# Patient Record
Sex: Female | Born: 1985 | Hispanic: No | Marital: Single | Smoking: Never smoker
Health system: Southern US, Community
[De-identification: ages and names within clinical notes are randomized; demographics above are authoritative.]

## PROBLEM LIST (undated history)

## (undated) DIAGNOSIS — O139 Gestational [pregnancy-induced] hypertension without significant proteinuria, unspecified trimester: Secondary | ICD-10-CM

---

## 2003-03-22 ENCOUNTER — Other Ambulatory Visit: Admission: RE | Admit: 2003-03-22 | Discharge: 2003-03-22 | Payer: Self-pay | Admitting: Obstetrics and Gynecology

## 2006-09-13 ENCOUNTER — Encounter: Admission: RE | Admit: 2006-09-13 | Discharge: 2006-09-13 | Payer: Self-pay | Admitting: Orthopedic Surgery

## 2011-09-11 ENCOUNTER — Other Ambulatory Visit: Payer: Self-pay | Admitting: Obstetrics and Gynecology

## 2014-10-25 ENCOUNTER — Other Ambulatory Visit (HOSPITAL_COMMUNITY): Payer: Self-pay | Admitting: Obstetrics & Gynecology

## 2014-10-25 DIAGNOSIS — N979 Female infertility, unspecified: Secondary | ICD-10-CM

## 2014-11-03 ENCOUNTER — Ambulatory Visit (HOSPITAL_COMMUNITY)
Admission: RE | Admit: 2014-11-03 | Discharge: 2014-11-03 | Disposition: A | Discharge status: Home / Self Care | Payer: BLUE CROSS/BLUE SHIELD | Source: Ambulatory Visit | Attending: Obstetrics & Gynecology | Admitting: Obstetrics & Gynecology

## 2014-11-03 DIAGNOSIS — N979 Female infertility, unspecified: Secondary | ICD-10-CM | POA: Diagnosis not present

## 2014-11-03 MED ORDER — IOHEXOL 300 MG/ML  SOLN
30.0000 mL | Freq: Once | INTRAMUSCULAR | Status: AC | PRN
  Administered 2014-11-03: 30 mL

## 2015-02-17 LAB — OB RESULTS CONSOLE HIV ANTIBODY (ROUTINE TESTING)
HIV: NONREACTIVE
HIV: NONREACTIVE

## 2015-05-07 NOTE — L&D Delivery Note (Signed)
Delivery Note At 7:13 AM a healthy female was delivered via Vaginal, Spontaneous Delivery (Presentation: Left Occiput Anterior).  APGAR: 8, 9; weight pending.   Placenta status: Intact, Spontaneous Pathology.  Cord: 3 vessels with the following complications: None.  Cord pH: N/A  Anesthesia: Epidural  Episiotomy: None Lacerations: None Suture Repair: 2.0 3.0 vicryl rapide Est. Blood Loss (mL): 200  Mom to postpartum.  Baby to Couplet care / Skin to Skin.  Jene Huq,MARIE-LYNE 08/30/2015, 7:55 AM

## 2015-08-14 LAB — OB RESULTS CONSOLE GBS: STREP GROUP B AG: POSITIVE

## 2015-08-25 ENCOUNTER — Other Ambulatory Visit: Payer: Self-pay | Admitting: Obstetrics & Gynecology

## 2015-08-28 ENCOUNTER — Inpatient Hospital Stay (HOSPITAL_COMMUNITY): Payer: BLUE CROSS/BLUE SHIELD | Admitting: Anesthesiology

## 2015-08-28 ENCOUNTER — Encounter (HOSPITAL_COMMUNITY): Payer: Self-pay

## 2015-08-28 ENCOUNTER — Inpatient Hospital Stay (HOSPITAL_COMMUNITY)
Admission: AD | Admit: 2015-08-28 | Discharge: 2015-09-01 | DRG: 775 | Disposition: A | Discharge status: Home / Self Care | Payer: BLUE CROSS/BLUE SHIELD | Source: Ambulatory Visit | Attending: Obstetrics & Gynecology | Admitting: Obstetrics & Gynecology

## 2015-08-28 DIAGNOSIS — O99824 Streptococcus B carrier state complicating childbirth: Secondary | ICD-10-CM | POA: Diagnosis present

## 2015-08-28 DIAGNOSIS — O134 Gestational [pregnancy-induced] hypertension without significant proteinuria, complicating childbirth: Secondary | ICD-10-CM | POA: Diagnosis present

## 2015-08-28 DIAGNOSIS — Z3A37 37 weeks gestation of pregnancy: Secondary | ICD-10-CM

## 2015-08-28 DIAGNOSIS — K219 Gastro-esophageal reflux disease without esophagitis: Secondary | ICD-10-CM | POA: Diagnosis present

## 2015-08-28 DIAGNOSIS — O9962 Diseases of the digestive system complicating childbirth: Secondary | ICD-10-CM | POA: Diagnosis present

## 2015-08-28 DIAGNOSIS — O139 Gestational [pregnancy-induced] hypertension without significant proteinuria, unspecified trimester: Secondary | ICD-10-CM | POA: Diagnosis present

## 2015-08-28 HISTORY — DX: Gestational (pregnancy-induced) hypertension without significant proteinuria, unspecified trimester: O13.9

## 2015-08-28 LAB — TYPE AND SCREEN
ABO/RH(D): O POS
ANTIBODY SCREEN: NEGATIVE

## 2015-08-28 LAB — CBC
HCT: 38 % (ref 36.0–46.0)
HCT: 35.1 % — ABNORMAL LOW (ref 36.0–46.0)
HEMOGLOBIN: 11.9 g/dL — AB (ref 12.0–15.0)
Hemoglobin: 13.2 g/dL (ref 12.0–15.0)
MCH: 29.9 pg (ref 26.0–34.0)
MCH: 29.1 pg (ref 26.0–34.0)
MCHC: 34.7 g/dL (ref 30.0–36.0)
MCHC: 33.9 g/dL (ref 30.0–36.0)
MCV: 86 fL (ref 78.0–100.0)
MCV: 85.8 fL (ref 78.0–100.0)
PLATELETS: 176 10*3/uL (ref 150–400)
Platelets: 171 10*3/uL (ref 150–400)
RBC: 4.42 MIL/uL (ref 3.87–5.11)
RBC: 4.09 MIL/uL (ref 3.87–5.11)
RDW: 12.9 % (ref 11.5–15.5)
RDW: 13.1 % (ref 11.5–15.5)
WBC: 9.7 10*3/uL (ref 4.0–10.5)
WBC: 13.4 10*3/uL — ABNORMAL HIGH (ref 4.0–10.5)

## 2015-08-28 LAB — COMPREHENSIVE METABOLIC PANEL
ALBUMIN: 3.2 g/dL — AB (ref 3.5–5.0)
ALK PHOS: 138 U/L — AB (ref 38–126)
ALT: 17 U/L (ref 14–54)
ANION GAP: 9 (ref 5–15)
AST: 22 U/L (ref 15–41)
BILIRUBIN TOTAL: 0.4 mg/dL (ref 0.3–1.2)
BUN: 10 mg/dL (ref 6–20)
CALCIUM: 9.3 mg/dL (ref 8.9–10.3)
CO2: 18 mmol/L — ABNORMAL LOW (ref 22–32)
Chloride: 111 mmol/L (ref 101–111)
Creatinine, Ser: 0.66 mg/dL (ref 0.44–1.00)
GFR calc Af Amer: >60 mL/min (ref >60)
GFR calc non Af Amer: >60 mL/min (ref >60)
GLUCOSE: 110 mg/dL — AB (ref 65–99)
Potassium: 3.9 mmol/L (ref 3.5–5.1)
Sodium: 138 mmol/L (ref 135–145)
TOTAL PROTEIN: 6.4 g/dL — AB (ref 6.5–8.1)

## 2015-08-28 LAB — OB RESULTS CONSOLE RPR: RPR: NONREACTIVE

## 2015-08-28 LAB — OB RESULTS CONSOLE GC/CHLAMYDIA
CHLAMYDIA, DNA PROBE: NEGATIVE
Gonorrhea: NEGATIVE

## 2015-08-28 LAB — PROTEIN / CREATININE RATIO, URINE
Creatinine, Urine: 35 mg/dL
Total Protein, Urine: <6 mg/dL

## 2015-08-28 LAB — RPR: RPR Ser Ql: NONREACTIVE

## 2015-08-28 LAB — ABO/RH: ABO/RH(D): O POS

## 2015-08-28 LAB — OB RESULTS CONSOLE HEPATITIS B SURFACE ANTIGEN: Hepatitis B Surface Ag: NEGATIVE

## 2015-08-28 LAB — OB RESULTS CONSOLE HIV ANTIBODY (ROUTINE TESTING): HIV: NONREACTIVE

## 2015-08-28 LAB — URIC ACID: URIC ACID, SERUM: 6.9 mg/dL — AB (ref 2.3–6.6)

## 2015-08-28 LAB — OB RESULTS CONSOLE RUBELLA ANTIBODY, IGM: Rubella: IMMUNE

## 2015-08-28 LAB — OB RESULTS CONSOLE ABO/RH: RH TYPE: POSITIVE

## 2015-08-28 LAB — LACTATE DEHYDROGENASE: LDH: 150 U/L (ref 98–192)

## 2015-08-28 MED ORDER — OXYTOCIN BOLUS FROM INFUSION
500.0000 mL | INTRAVENOUS | Status: DC
  Administered 2015-08-30: 500 mL via INTRAVENOUS

## 2015-08-28 MED ORDER — DIPHENHYDRAMINE HCL 50 MG/ML IJ SOLN: 12.5000 mg | INTRAMUSCULAR | Status: DC | PRN

## 2015-08-28 MED ORDER — OXYTOCIN 10 UNIT/ML IJ SOLN
1.0000 m[IU]/min | INTRAVENOUS | Status: DC
  Administered 2015-08-28 – 2015-08-29 (×2): 2 m[IU]/min via INTRAVENOUS
  Filled 2015-08-28: qty 4

## 2015-08-28 MED ORDER — ONDANSETRON HCL 4 MG/2ML IJ SOLN
4.0000 mg | Freq: Four times a day (QID) | INTRAMUSCULAR | Status: DC | PRN
  Administered 2015-08-30: 4 mg via INTRAVENOUS
  Filled 2015-08-28: qty 2

## 2015-08-28 MED ORDER — LACTATED RINGERS IV SOLN: 500.0000 mL | INTRAVENOUS | Status: DC | PRN

## 2015-08-28 MED ORDER — PHENYLEPHRINE 40 MCG/ML (10ML) SYRINGE FOR IV PUSH (FOR BLOOD PRESSURE SUPPORT)
80.0000 ug | PREFILLED_SYRINGE | INTRAVENOUS | Status: DC | PRN
  Filled 2015-08-28: qty 5
  Filled 2015-08-28 (×2): qty 20

## 2015-08-28 MED ORDER — LACTATED RINGERS IV SOLN: 500.0000 mL | Freq: Once | INTRAVENOUS | Status: DC

## 2015-08-28 MED ORDER — OXYTOCIN 10 UNIT/ML IJ SOLN
2.5000 [IU]/h | INTRAVENOUS | Status: DC
  Filled 2015-08-28: qty 4

## 2015-08-28 MED ORDER — MISOPROSTOL 25 MCG QUARTER TABLET
25.0000 ug | ORAL_TABLET | ORAL | Status: DC
  Administered 2015-08-28: 25 ug via VAGINAL
  Filled 2015-08-28: qty 1
  Filled 2015-08-28: qty 0.25

## 2015-08-28 MED ORDER — PENICILLIN G POTASSIUM 5000000 UNITS IJ SOLR
2.5000 10*6.[IU] | INTRAVENOUS | Status: DC
  Administered 2015-08-28 – 2015-08-29 (×7): 2.5 10*6.[IU] via INTRAVENOUS
  Filled 2015-08-28 (×9): qty 2.5

## 2015-08-28 MED ORDER — PHENYLEPHRINE 40 MCG/ML (10ML) SYRINGE FOR IV PUSH (FOR BLOOD PRESSURE SUPPORT)
80.0000 ug | PREFILLED_SYRINGE | INTRAVENOUS | Status: DC | PRN
  Filled 2015-08-28: qty 5
  Filled 2015-08-28: qty 20

## 2015-08-28 MED ORDER — OXYCODONE-ACETAMINOPHEN 5-325 MG PO TABS: 2.0000 | ORAL_TABLET | ORAL | Status: DC | PRN

## 2015-08-28 MED ORDER — FENTANYL 2.5 MCG/ML BUPIVACAINE 1/10 % EPIDURAL INFUSION (WH - ANES)
14.0000 mL/h | INTRAMUSCULAR | Status: DC | PRN
  Administered 2015-08-29 – 2015-08-30 (×6): 14 mL/h via EPIDURAL
  Filled 2015-08-28 (×7): qty 125

## 2015-08-28 MED ORDER — EPHEDRINE 5 MG/ML INJ
10.0000 mg | INTRAVENOUS | Status: DC | PRN
  Filled 2015-08-28: qty 2

## 2015-08-28 MED ORDER — OXYCODONE-ACETAMINOPHEN 5-325 MG PO TABS: 1.0000 | ORAL_TABLET | ORAL | Status: DC | PRN

## 2015-08-28 MED ORDER — LIDOCAINE HCL (PF) 1 % IJ SOLN
30.0000 mL | INTRAMUSCULAR | Status: AC | PRN
  Administered 2015-08-30: 30 mL via SUBCUTANEOUS
  Filled 2015-08-28: qty 30

## 2015-08-28 MED ORDER — ACETAMINOPHEN 325 MG PO TABS
650.0000 mg | ORAL_TABLET | ORAL | Status: DC | PRN
  Administered 2015-08-29: 650 mg via ORAL
  Filled 2015-08-28: qty 2

## 2015-08-28 MED ORDER — LACTATED RINGERS IV SOLN
INTRAVENOUS | Status: DC
  Administered 2015-08-28: 1000 mL via INTRAVENOUS
  Administered 2015-08-29 (×2): via INTRAVENOUS

## 2015-08-28 MED ORDER — ONDANSETRON HCL 4 MG/2ML IJ SOLN
4.0000 mg | Freq: Once | INTRAMUSCULAR | Status: AC
  Administered 2015-08-29: 4 mg via INTRAVENOUS
  Filled 2015-08-28: qty 2

## 2015-08-28 MED ORDER — PENICILLIN G POTASSIUM 5000000 UNITS IJ SOLR
5.0000 10*6.[IU] | Freq: Once | INTRAVENOUS | Status: AC
  Administered 2015-08-28: 5 10*6.[IU] via INTRAVENOUS
  Filled 2015-08-28: qty 5

## 2015-08-28 MED ORDER — CITRIC ACID-SODIUM CITRATE 334-500 MG/5ML PO SOLN
30.0000 mL | ORAL | Status: DC | PRN
  Administered 2015-08-29: 30 mL via ORAL
  Filled 2015-08-28: qty 15

## 2015-08-28 MED ORDER — LABETALOL HCL 200 MG PO TABS
200.0000 mg | ORAL_TABLET | Freq: Three times a day (TID) | ORAL | Status: DC
  Administered 2015-08-28 – 2015-09-01 (×12): 200 mg via ORAL
  Filled 2015-08-28 (×14): qty 1

## 2015-08-28 MED ORDER — TERBUTALINE SULFATE 1 MG/ML IJ SOLN
0.2500 mg | Freq: Once | INTRAMUSCULAR | Status: DC | PRN
  Filled 2015-08-28: qty 1

## 2015-08-28 MED ORDER — FLEET ENEMA 7-19 GM/118ML RE ENEM: 1.0000 | ENEMA | RECTAL | Status: DC | PRN

## 2015-08-28 NOTE — H&P (Signed)
Heather Medina is a 30 y.o. female G1P0 37+ wks presenting for Induction re PIH on Labetalol.  HPP/HPI:  Increased BPs in 3rd trimester.  Started on Labetalol, now on 200 TID.  No PEC Sx except lower limb oedema.  PIH labs wnl but U. Acid borderline at 7.1.  Fetus AGA per US.  .  OB History    Gravida Para Term Preterm AB TAB SAB Ectopic Multiple Living   1              Past Medical History  Diagnosis Date  . Pregnancy induced hypertension    History reviewed. No pertinent past surgical history. Family History: family history is not on file. Social History:  reports that she has never smoked. She does not have any smokeless tobacco history on file. She reports that she does not drink alcohol or use illicit drugs.  Allergies  Allergen Reactions  . Sulfur Other (See Comments)    Childhood allergy    Dilation: Closed Effacement (%): Thick Station: -2, -1 Exam by:: d herr rn   Blood pressure 135/91, pulse 86, temperature 99 F (37.2 C), temperature source Oral, resp. rate 18, height 5\' 9"  (1.753 m), weight 215 lb (97.523 kg), last menstrual period 10/25/2014. Exam Physical Exam   FHR Base line 130's with good variability and accelerations, no deceleration. UCs  Mild q2-5 min on Cytotec  HPP:  Patient Active Problem List   Diagnosis Date Noted  . PIH (pregnancy induced hypertension) 08/28/2015    Prenatal labs: ABO, Rh: --/--/O POS (04/24 95620812) Antibody: NEG (04/24 0812) Rubella: Immune RPR: Nonreactive (04/24 13080922)  HBsAg: Negative (04/24 65780922)  HIV: Non-reactive (04/24 [id number])  Genetic testing: Ultrasreen wnl, AFP1 neg US anato: wnl 1 hr GTT: wnl GBS:  Pos  PIH labs today:  AST/ALT wnl.  Plts wnl at 176.  U. Acid stable but borderline high at 6.9 (7.1 in office last wk)                           Prtn/Creat pending.  Assessment/Plan: 37 + wks with PIH on Labetalol, no evidence of PEC at this time.  FHR Cat 1.  GBS pos, Pen G.  Cytotec/Pitocin/AROM.  Monitoring.   Expectant management towards probable vaginal delivery.     Heather Medina,MARIE-LYNE 08/28/2015, 12:28 PM

## 2015-08-28 NOTE — Progress Notes (Signed)
Subjective: PIH/GBS pos Doing well, pain mild, UCs q2-3 min mild  Anesthesia none   Objective: BP 141/94 mmHg  Pulse 83  Temp(Src) 98.8 F (37.1 C) (Oral)  Resp 18  Ht 5\' 9"  (1.753 m)  Wt 215 lb (97.523 kg)  BMI 31.74 kg/m2  LMP 12/15/2014   FHT:  FHR: 140 bpm, variability: moderate,  accelerations:  Present,  decelerations:  Absent UC:   regular, every 2-3 minutes VE:   Dilation: 1.5 Effacement (%): 70 Station: -2 Exam by:: dr Seymour BarsLavoie  AROM AF clear   Assessment / Plan: Induction of labor due to gestational hypertension,  progressing well on pitocin.  Pen G for GBS.  Fetal Wellbeing:  Category I Pain Control:  Labor support without medications/Epidural PRN  Anticipated MOD:  NSVD  Heather Medina,Heather Medina 08/28/2015, 4:39 PM

## 2015-08-28 NOTE — Anesthesia Preprocedure Evaluation (Addendum)
Anesthesia Evaluation  Patient identified by MRN, date of birth, ID band Patient awake    Reviewed: Allergy & Precautions, NPO status , Patient's Chart, lab work & pertinent test results  Airway Mallampati: I  TM Distance: >3 FB Neck ROM: Full    Dental  (+) Teeth Intact   Pulmonary neg pulmonary ROS,    breath sounds clear to auscultation       Cardiovascular hypertension, Pt. on medications  Rhythm:Regular Rate:Normal     Neuro/Psych negative neurological ROS     GI/Hepatic Neg liver ROS, GERD  Medicated and Controlled,  Endo/Other  negative endocrine ROS  Renal/GU negative Renal ROS     Musculoskeletal   Abdominal   Peds  Hematology plt 171k   Anesthesia Other Findings   Reproductive/Obstetrics (+) Pregnancy Pregnancy induced HTN                            Anesthesia Physical Anesthesia Plan  ASA: II  Anesthesia Plan: Epidural   Post-op Pain Management:    Induction:   Airway Management Planned:   Additional Equipment:   Intra-op Plan:   Post-operative Plan:   Informed Consent: I have reviewed the patients History and Physical, chart, labs and discussed the procedure including the risks, benefits and alternatives for the proposed anesthesia with the patient or authorized representative who has indicated his/her understanding and acceptance.     Plan Discussed with: CRNA and Surgeon  Anesthesia Plan Comments: (Patient identified. Risks/Benefits/Options discussed with patient including but not limited to bleeding, infection, nerve damage, paralysis, failed block, incomplete pain control, headache, blood pressure changes, nausea, vomiting, reactions to medication both or allergic, itching and postpartum back pain. Confirmed with bedside nurse the patient's most recent platelet count. Confirmed with patient that they are not currently taking any anticoagulation, have any  bleeding history or any family history of bleeding disorders. Patient expressed understanding and wished to proceed. All questions were answered.  )        Anesthesia Quick Evaluation

## 2015-08-29 MED ORDER — LACTATED RINGERS IV SOLN
500.0000 mL | Freq: Once | INTRAVENOUS | Status: DC
Start: 2015-08-29 — End: 2015-08-29

## 2015-08-29 MED ORDER — BUPIVACAINE HCL (PF) 0.25 % IJ SOLN
INTRAMUSCULAR | Status: DC | PRN
  Administered 2015-08-29 (×2): 5 mL via EPIDURAL

## 2015-08-29 MED ORDER — LIDOCAINE HCL (PF) 1 % IJ SOLN
INTRAMUSCULAR | Status: DC | PRN
  Administered 2015-08-28: 6 mL via EPIDURAL
  Administered 2015-08-29: 8 mL via EPIDURAL
  Administered 2015-08-29: 6 mL via EPIDURAL

## 2015-08-29 MED ORDER — EPHEDRINE 5 MG/ML INJ: 10.0000 mg | INTRAVENOUS | Status: DC | PRN

## 2015-08-29 MED ORDER — SODIUM CHLORIDE 0.9 % IV SOLN
3.0000 g | Freq: Four times a day (QID) | INTRAVENOUS | Status: DC
  Administered 2015-08-29 – 2015-08-30 (×2): 3 g via INTRAVENOUS
  Filled 2015-08-29 (×3): qty 3

## 2015-08-29 MED ORDER — SODIUM BICARBONATE 8.4 % IV SOLN
INTRAVENOUS | Status: DC | PRN
  Administered 2015-08-29 (×2): 5 mL via EPIDURAL

## 2015-08-29 MED ORDER — PHENYLEPHRINE 40 MCG/ML (10ML) SYRINGE FOR IV PUSH (FOR BLOOD PRESSURE SUPPORT): 80.0000 ug | PREFILLED_SYRINGE | INTRAVENOUS | Status: DC | PRN

## 2015-08-29 MED ORDER — LIDOCAINE HCL (PF) 2 % IJ SOLN: INTRAMUSCULAR | Status: DC | PRN

## 2015-08-29 MED ORDER — DIPHENHYDRAMINE HCL 50 MG/ML IJ SOLN: 12.5000 mg | INTRAMUSCULAR | Status: DC | PRN

## 2015-08-29 NOTE — Progress Notes (Addendum)
Subjective: Doing well, but pain not well controled, UCs difficult to trace  Anesthesia epidural (readjusted)   Objective: BP 131/86 mmHg  Pulse 90  Temp(Src) 98.6 F (37 C) (Oral)  Resp 16  Ht 5\' 9"  (1.753 m)  Wt 215 lb (97.523 kg)  BMI 31.74 kg/m2  LMP 12/15/2014   FHT:  FHR: 140's bpm, variability: moderate,  accelerations:  Present,  decelerations:  Absent UC:   Difficult to trace, patient feels tightenings VE:   Dilation: 3 Effacement (%): 80 Station: -2 Exam by:: Dr. Seymour BarsLavoie  IUPC put in place easily.  UCs q4-5 min, very low MVU.  Assessment / Plan: Induction for PIH.  Unfavorable cervix, still not in active labor.  Will increase Pitocin with IUPC based on MVU.  GBS pos Pen G covered.  No sign of Chorio.  Fetal Wellbeing:  Category I Pain Control:  Epidural/need to readjust again, anesthesia called by the nurse.  Anticipated MOD:  NSVD  Heather Medina,Heather Medina 08/29/2015, 9:08 AM

## 2015-08-29 NOTE — Anesthesia Procedure Notes (Addendum)
Epidural Patient location during procedure: OB Start time: 08/28/2015 11:45 PM End time: 08/29/2015 12:04 AM  Staffing Anesthesiologist: Jairo BenJACKSON, CARSWELL Performed by: anesthesiologist   Preanesthetic Checklist Completed: patient identified, surgical consent, pre-op evaluation, timeout performed, IV checked, risks and benefits discussed and monitors and equipment checked  Epidural Patient position: sitting Prep: ChloraPrep and site prepped and draped Patient monitoring: heart rate, continuous pulse ox and blood pressure Approach: midline Location: L3-L4 Injection technique: LOR air  Needle:  Needle type: Tuohy  Needle gauge: 17 G Needle length: 9 cm Needle insertion depth: 5 cm Catheter type: closed end flexible Catheter size: 19 Gauge Catheter at skin depth: 11 cm Test dose: negative and Other (1% lido)  Additional Notes Pt identified in Labor room.  Monitors applied. Working IV access confirmed. Sterile prep, drape lumbar spine.  1% lido local L 3,4.  #17ga Touhy LOR air ar 5 cm, cath in easily to 11 cm at skin.  6cc 1% lido test ok, epidural dosed, infusion begun.  Patient asymptomatic, VSS, no heme aspirated, tolerated well.  Heather Craze Jackson, MD Reason for block:procedure for pain  Epidural Patient location during procedure: OB Start time: 08/29/2015 10:25 AM End time: 08/29/2015 10:30 AM  Staffing Anesthesiologist: Heather Medina, Aniello Christopoulos Performed by: anesthesiologist   Preanesthetic Checklist Completed: patient identified, surgical consent, pre-op evaluation, timeout performed, IV checked, risks and benefits discussed and monitors and equipment checked  Epidural Patient position: sitting Prep: site prepped and draped and DuraPrep Patient monitoring: continuous pulse ox and blood pressure Approach: midline Location: L3-L4 Injection technique: LOR air  Needle:  Needle type: Tuohy  Needle gauge: 17 G Needle length: 9 cm and 9 Needle insertion depth: 5 cm cm Catheter  type: closed end flexible Catheter size: 19 Gauge Catheter at skin depth: 10 cm Test dose: negative and Other  Assessment Sensory level: T8 Events: blood not aspirated, injection not painful, no injection resistance, negative IV test and no paresthesia  Additional Notes Reason for block:procedure for pain

## 2015-08-30 ENCOUNTER — Encounter (HOSPITAL_COMMUNITY): Payer: Self-pay

## 2015-08-30 LAB — CBC
HCT: 35.7 % — ABNORMAL LOW (ref 36.0–46.0)
HCT: 33.3 % — ABNORMAL LOW (ref 36.0–46.0)
Hemoglobin: 12.1 g/dL (ref 12.0–15.0)
Hemoglobin: 11.4 g/dL — ABNORMAL LOW (ref 12.0–15.0)
MCH: 29.4 pg (ref 26.0–34.0)
MCH: 29.7 pg (ref 26.0–34.0)
MCHC: 33.9 g/dL (ref 30.0–36.0)
MCHC: 34.2 g/dL (ref 30.0–36.0)
MCV: 86.9 fL (ref 78.0–100.0)
MCV: 86.7 fL (ref 78.0–100.0)
Platelets: 174 10*3/uL (ref 150–400)
Platelets: 157 10*3/uL (ref 150–400)
RBC: 4.11 MIL/uL (ref 3.87–5.11)
RBC: 3.84 MIL/uL — ABNORMAL LOW (ref 3.87–5.11)
RDW: 12.9 % (ref 11.5–15.5)
RDW: 13.1 % (ref 11.5–15.5)
WBC: 19.2 10*3/uL — ABNORMAL HIGH (ref 4.0–10.5)
WBC: 18.7 10*3/uL — ABNORMAL HIGH (ref 4.0–10.5)

## 2015-08-30 LAB — COMPREHENSIVE METABOLIC PANEL
ALK PHOS: 109 U/L (ref 38–126)
ALT: 15 U/L (ref 14–54)
AST: 25 U/L (ref 15–41)
Albumin: 2.5 g/dL — ABNORMAL LOW (ref 3.5–5.0)
Anion gap: 5 (ref 5–15)
BILIRUBIN TOTAL: 0.8 mg/dL (ref 0.3–1.2)
BUN: 6 mg/dL (ref 6–20)
CALCIUM: 8.1 mg/dL — AB (ref 8.9–10.3)
CO2: 22 mmol/L (ref 22–32)
CREATININE: 0.77 mg/dL (ref 0.44–1.00)
Chloride: 107 mmol/L (ref 101–111)
Glucose, Bld: 103 mg/dL — ABNORMAL HIGH (ref 65–99)
Potassium: 3.6 mmol/L (ref 3.5–5.1)
Sodium: 134 mmol/L — ABNORMAL LOW (ref 135–145)
TOTAL PROTEIN: 5.6 g/dL — AB (ref 6.5–8.1)

## 2015-08-30 LAB — URIC ACID: URIC ACID, SERUM: 6.4 mg/dL (ref 2.3–6.6)

## 2015-08-30 MED ORDER — IBUPROFEN 600 MG PO TABS
600.0000 mg | ORAL_TABLET | Freq: Four times a day (QID) | ORAL | Status: DC
  Administered 2015-08-30 – 2015-09-01 (×9): 600 mg via ORAL
  Filled 2015-08-30 (×9): qty 1

## 2015-08-30 MED ORDER — DIBUCAINE 1 % RE OINT: 1.0000 "application " | TOPICAL_OINTMENT | RECTAL | Status: DC | PRN

## 2015-08-30 MED ORDER — DIPHENHYDRAMINE HCL 25 MG PO CAPS: 25.0000 mg | ORAL_CAPSULE | Freq: Four times a day (QID) | ORAL | Status: DC | PRN

## 2015-08-30 MED ORDER — ZOLPIDEM TARTRATE 5 MG PO TABS: 5.0000 mg | ORAL_TABLET | Freq: Every evening | ORAL | Status: DC | PRN

## 2015-08-30 MED ORDER — WITCH HAZEL-GLYCERIN EX PADS: 1.0000 "application " | MEDICATED_PAD | CUTANEOUS | Status: DC | PRN

## 2015-08-30 MED ORDER — ONDANSETRON HCL 4 MG/2ML IJ SOLN: 4.0000 mg | INTRAMUSCULAR | Status: DC | PRN

## 2015-08-30 MED ORDER — COCONUT OIL OIL: 1.0000 "application " | TOPICAL_OIL | Status: DC | PRN

## 2015-08-30 MED ORDER — SIMETHICONE 80 MG PO CHEW
80.0000 mg | CHEWABLE_TABLET | ORAL | Status: DC | PRN
  Administered 2015-08-31: 80 mg via ORAL
  Filled 2015-08-30: qty 1

## 2015-08-30 MED ORDER — BENZOCAINE-MENTHOL 20-0.5 % EX AERO
1.0000 "application " | INHALATION_SPRAY | CUTANEOUS | Status: DC | PRN
  Filled 2015-08-30: qty 56

## 2015-08-30 MED ORDER — ONDANSETRON HCL 4 MG PO TABS: 4.0000 mg | ORAL_TABLET | ORAL | Status: DC | PRN

## 2015-08-30 MED ORDER — LORATADINE 10 MG PO TABS
10.0000 mg | ORAL_TABLET | Freq: Every day | ORAL | Status: DC
  Administered 2015-09-01: 10 mg via ORAL
  Filled 2015-08-30: qty 1

## 2015-08-30 MED ORDER — PRENATAL MULTIVITAMIN CH
1.0000 | ORAL_TABLET | Freq: Every day | ORAL | Status: DC
  Administered 2015-08-30 – 2015-09-01 (×3): 1 via ORAL
  Filled 2015-08-30 (×3): qty 1

## 2015-08-30 MED ORDER — TETANUS-DIPHTH-ACELL PERTUSSIS 5-2.5-18.5 LF-MCG/0.5 IM SUSP: 0.5000 mL | Freq: Once | INTRAMUSCULAR | Status: DC

## 2015-08-30 MED ORDER — SENNOSIDES-DOCUSATE SODIUM 8.6-50 MG PO TABS
2.0000 | ORAL_TABLET | ORAL | Status: DC
  Administered 2015-08-30 – 2015-09-01 (×2): 2 via ORAL
  Filled 2015-08-30 (×2): qty 2

## 2015-08-30 MED ORDER — OXYTOCIN 10 UNIT/ML IJ SOLN: 2.5000 [IU]/h | INTRAVENOUS | Status: DC | PRN

## 2015-08-30 MED ORDER — FLUTICASONE PROPIONATE 50 MCG/ACT NA SUSP: 2.0000 | Freq: Every day | NASAL | Status: DC | PRN

## 2015-08-30 MED ORDER — ACETAMINOPHEN 325 MG PO TABS: 650.0000 mg | ORAL_TABLET | ORAL | Status: DC | PRN

## 2015-08-30 NOTE — Anesthesia Postprocedure Evaluation (Signed)
Anesthesia Post Note  Patient: Heather Medina  Procedure(s) Performed: * No procedures listed *  Patient location during evaluation: Mother Baby Anesthesia Type: Epidural Level of consciousness: awake and alert and oriented Pain management: pain level controlled Vital Signs Assessment: post-procedure vital signs reviewed and stable Respiratory status: spontaneous breathing Cardiovascular status: blood pressure returned to baseline Postop Assessment: no headache, no backache, epidural receding, patient able to bend at knees, no signs of nausea or vomiting and adequate PO intake Anesthetic complications: no     Last Vitals:  Filed Vitals:   08/30/15 0950 08/30/15 1105  BP: 126/88 136/90  Pulse: 100 100  Temp: 37.1 C 36.8 C  Resp: 20 20    Last Pain:  Filed Vitals:   08/30/15 1203  PainSc: 1    Pain Goal: Patients Stated Pain Goal: 3 (08/30/15 1105)               Neo Yepiz

## 2015-08-30 NOTE — Lactation Note (Signed)
This note was copied from a baby's chart. Lactation Consultation Note  Patient Name: Girl Patria ManeMegan Palmatier ZOXWR'UToday's Date: 08/30/2015 Reason for consult: Initial assessment Baby at 11 hr of life and mom reports bf is going well. She denies breast or nipple pain, voiced no concerns. Mom had breast implants 11 yr ago. She reported positive breast changes during pregnancy.  Discussed baby behavior, feeding frequency, baby belly size, voids, wt loss, breast changes, and nipple care. Demonstrated manual expression, large drops of colostrum noted bilaterally, spoon in room. Given lactation and LPT infant handouts. Set up DEBP.  Aware of OP services and support group. Mom will offer the breast 8+/24hr on demand and f/u each bf with manual expression and spoon feeding.      Maternal Data Has patient been taught Hand Expression?: Yes Does the patient have breastfeeding experience prior to this delivery?: No  Feeding    LATCH Score/Interventions                      Lactation Tools Discussed/Used     Consult Status Consult Status: Follow-up Date: 08/31/15 Follow-up type: In-patient    Rulon Eisenmengerlizabeth E Sindy Mccune 08/30/2015, 7:07 PM

## 2015-08-31 NOTE — Lactation Note (Signed)
This note was copied from a baby's chart. Lactation Consultation Note  Patient Name: Heather Medina ZOXWR'UToday's Date: 08/31/2015 Reason for consult: Follow-up assessment;Infant < 6lbs   Visited with Mom, baby 3731 hrs old.  Some jaundice noted, adequate output.  Mom with a lot of visitors.  Mom stated baby has been breast feeding a lot and well.  Offered assistance with latching baby.  Mom preferred to keep baby wrapped up in blanket but talked about benefits of skin to skin on the breast.  Mom using cradle hold, so talked to her about a better control of baby's head.  Baby opens wide and with guidance, latches deeply onto areola.  Baby suckles for a few minutes and then becomes sleepy, and becomes shallow.  Showed Mom how to keep her stimulated to be more actively nursing.  A couple swallows visible, and audible.  Taught Mom what to listen and look for.  Recommended she add some pumping to the plan to stimulate and support her milk supply.  Recommended manual breast expression, and massage as well.  Curved tip syringe given, and her RN will teach her on use of this will any EBM.  DEBP set up, but more family came into room.  RN to teach Mom on pumping post breast feeding.  To ask for help prn, and LC to follow up in am.  Lactation Tools Discussed/Used [facility] No Pump Review: Setup, frequency, and cleaning;Milk Storage Initiated by:: Johny Blameraroline Jahne Krukowski RN IBCLC Date initiated:: 08/31/15   Consult Status Consult Status: Follow-up Date: 09/01/15 Follow-up type: In-patient    Judee ClaraSmith, Loukisha Gunnerson E 08/31/2015, 2:33 PM

## 2015-08-31 NOTE — Progress Notes (Signed)
Patient ID: Heather Medina, female   DOB: 04-Oct-1985, 30 y.o.   MRN: [id number] PPD # 1 SVD  S:  Reports feeling well.             Tolerating po/ No nausea or vomiting             Bleeding is light             Pain controlled with ibuprofen (OTC)             Up ad lib / ambulatory / voiding without difficulties    Newborn  Information for the patient's newborn:  [name], Girl [name] [[id number]][[id number]]  female  breast feeding   O:  A & O x 3, in no apparent distress              VS:  Filed Vitals:   08/30/15 1725 08/30/15 2144 08/30/15 2340 08/31/15 0526  BP: 115/70 131/73 103/61 112/64  Pulse: 101 113 104 92  Temp: 98.2 F (36.8 C)  97.8 F (36.6 C) 98.2 F (36.8 C)  TempSrc: Oral  Oral Oral  Resp: 20  18 20   Height:      Weight:      SpO2:   98% 97%    LABS:  Recent Labs  08/30/15 0816 08/30/15 1342  WBC 19.2* 18.7*  HGB 12.1 11.4*  HCT 35.7* 33.3*  PLT 174 157    Blood type: O POS (04/24 [id number])  Rubella: Immune (04/24 0922)   I&O: I/O last 3 completed shifts: In: -  Out: 3400 [Urine:3100; Blood:300]             Lungs: Clear and unlabored  Heart: regular rate and rhythm / no murmurs  Abdomen: soft, non-tender, non-distended             Fundus: firm, non-tender, U-1  Perineum: intact, mild edema - ice pack in place  Lochia: minimal  Extremities: No edema, no calf pain or tenderness, No Homans    A/P: PPD # 1  29 y.o., G1P1001   Principal Problem:    Postpartum care following vaginal delivery (4/26)  Active Problems:    PIH (pregnancy induced hypertension)    Postpartum state   Doing well - stable status  Routine post partum orders  Anticipate discharge tomorrow    Raelyn MoraAWSON, Makinsey Pepitone, M, MSN, CNM 08/31/2015, 11:42 AM

## 2015-09-01 MED ORDER — IBUPROFEN 600 MG PO TABS: 600.0000 mg | ORAL_TABLET | Freq: Four times a day (QID) | ORAL | Status: AC

## 2015-09-01 NOTE — Progress Notes (Signed)
PPD 2 SVD  S:  Reports feeling well             Tolerating po/ No nausea or vomiting             Bleeding is light             Pain controlled with motrin             Up ad lib / ambulatory / voiding QS  Newborn breast feeding  / newborn under bili-lights  O:               VS: BP 128/74 mmHg  Pulse 99  Temp(Src) 98.2 F (36.8 C) (Oral)  Resp 16  Ht 5\' 9"  (1.753 m)  Wt 97.523 kg (215 lb)  BMI 31.74 kg/m2  SpO2 100%  LMP 12/15/2014  Breastfeeding? Unknown   LABS:              Recent Labs  08/30/15 0816 08/30/15 1342  WBC 19.2* 18.7*  HGB 12.1 11.4*  PLT 174 157               Blood type: --/--/O POS, O POS (04/24 [id number])  Rubella: Immune (04/24 [id number])                          Physical Exam:             Alert and oriented X3             Abdomen: soft, non-tender, non-distended         Extremities: noedema, no calf pain or tenderness    A: PPD # 2   Doing well - stable status  P: Routine post partum orders  DC home  Marlinda MikeBAILEY, Magdalene Tardiff CNM, MSN, [facility] 09/01/2015, 1:43 PM

## 2015-09-01 NOTE — Discharge Summary (Signed)
Obstetric Discharge Summary  Reason for Admission: induction of labor Prenatal Procedures: NST / SONO / PIH labs Intrapartum Procedures: spontaneous vaginal delivery and GBS prophylaxis and epidural Postpartum Procedures: none Complications-Operative and Postpartum: none HEMOGLOBIN  Date Value Ref Range Status  08/30/2015 11.4* 12.0 - 15.0 g/dL Final   HCT  Date Value Ref Range Status  08/30/2015 33.3* 36.0 - 46.0 % Final    Physical Exam:  General: alert, cooperative and no distress Lochia: appropriate Uterine Fundus: firm Incision: healing well DVT Evaluation: No evidence of DVT seen on physical exam.  Discharge Diagnoses: Term Pregnancy-delivered  Discharge Information: Date: 09/01/2015 Activity: pelvic rest Diet: routine Medications: PNV and Ibuprofen Condition: stable Instructions: refer to practice specific booklet Discharge to: home Follow-up Information    Follow up with LAVOIE,MARIE-LYNE, MD. Schedule an appointment as soon as possible for a visit in 1 week.   Specialty:  Obstetrics and Gynecology   Why:  BP check   Contact information:   [address] Severe[address] [place] [id number] [phone]       Newborn Data: Live born female  Birth Weight: 5 lb 14.4 oz (2676 g) APGAR: 8, 9  Home with mother.  Marlinda MikeBAILEY, TANYA 09/01/2015, 1:45 PM

## 2015-09-01 NOTE — Lactation Note (Signed)
This note was copied from a baby's chart. Lactation Consultation Note: mom had baby latched to the breast when I went into room. Getting sleepy, non nutritive no swallows noted. Mom reports she has been feeding on and off for hour . Reviewed importance of frequent nursing since baby is under phototherapy. Assisted mom with football hold- she is using cradle hold very feeding. Baby took a few more sucks then off to sleep. Encouraged mom to continue pumping to promote milk supply. No questions at present. To call for assist prn  Patient Name: Heather Patria ManeMegan Medina ZOXWR'UToday's Date: 09/01/2015 Reason for consult: Follow-up assessment;Infant < 6lbs   Maternal Data Formula Feeding for Exclusion: No Has patient been taught Hand Expression?: Yes Does the patient have breastfeeding experience prior to this delivery?: No  Feeding Feeding Type: Breast Fed Length of feed: 20 min  LATCH Score/Interventions Latch: Grasps breast easily, tongue down, lips flanged, rhythmical sucking.  Audible Swallowing: None  Type of Nipple: Everted at rest and after stimulation  Comfort (Breast/Nipple): Filling, red/small blisters or bruises, mild/mod discomfort  Problem noted: Mild/Moderate discomfort Interventions (Mild/moderate discomfort): Hand expression  Hold (Positioning): No assistance needed to correctly position infant at breast.  LATCH Score: 7  Lactation Tools Discussed/Used     Consult Status Consult Status: Follow-up Date: 09/02/15 Follow-up type: In-patient    Pamelia HoitWeeks, Navarro Nine D 09/01/2015, 11:02 AM

## 2015-09-02 ENCOUNTER — Ambulatory Visit: Payer: Self-pay

## 2015-09-02 NOTE — Lactation Note (Signed)
This note was copied from a baby's chart. Lactation Consultation Note  Patient Name: Girl Patria ManeMegan Hackler MVHQI'OToday's Date: 09/02/2015 Reason for consult: Follow-up assessment;Infant < 6lbs;Late preterm infant;Hyperbilirubinemia;Infant weight loss  2nd visit today - at 12n , mom changed a void and stool. Mom started in cross cradle, and baby on and off without depth.  LC assisted mom to switch to the football position. And photo tx blanket laid underneath baby. Mom seemed to be more  comfortable and better positioning for baby. Baby latched and then [facility] showed mom how to insert 5 F Feeding tube. In the corner of the mouth.  @ 1st baby obtained a good seal and released. Over about 12 mins bay took 12 ml of Alimentum. Released , mom burped the baby  and she woke  Back up and seemed hungry, showing feeding cues. Mom re- latched easily with depth and dad inserted 73F feeding tube with instructions. Baby in a  Swallowing feeding pattern and released . LC assisted to re-latch with depth and inserted tube. LC stressed to parents 12 ml is an ok feeding supplementing  amount but we should be gradually increasing. Baby last fed at 0855 when she took 16 ml finger feeding . And the volume was probably greater due to being latched  At the breast , swallows noted before the 73F feeding tube inserted , increased with breast compressions.  Baby going home on single photo tx and LC recommended if baby comes off the lights tomorrow to call for [facility]. For this coming Thursday or Friday.    Maternal Data Has patient been taught Hand Expression?: Yes  Feeding Feeding Type: Formula Length of feed: 12 min (on and off pattern with 73F feeding tube )  LATCH Score/Interventions Latch: Repeated attempts needed to sustain latch, nipple held in mouth throughout feeding, stimulation needed to elicit sucking reflex. Intervention(s): Adjust position;Assist with latch;Breast massage;Breast compression  Audible Swallowing:  Spontaneous and intermittent  Type of Nipple: Everted at rest and after stimulation  Comfort (Breast/Nipple): Soft / non-tender     Hold (Positioning): Assistance needed to correctly position infant at breast and maintain latch. Intervention(s): Breastfeeding basics reviewed;Support Pillows;Skin to skin;Position options  LATCH Score: 8  Lactation Tools Discussed/Used Tools: Pump;73F feeding tube / Syringe Breast pump type: Double-Electric Breast Pump   Consult Status Consult Status: Complete Date: 09/02/15 Follow-up type: In-patient    Kathrin Greathouseorio, Taevyn Hausen Ann 09/02/2015, 2:25 PM

## 2015-09-02 NOTE — Lactation Note (Signed)
This note was copied from a baby's chart. Lactation Consultation Note  Patient Name: Heather Medina NFAOZ'HToday's Date: 09/02/2015 Reason for consult: Follow-up assessment;Hyperbilirubinemia;Infant < 6lbs;Other (Comment);Infant weight loss (early term )  Baby is going home today on single photo tx. Weight today is 5-6.2 oz. Bili 14.9 . Per mom still trying at the breast , but finger fed  Over night. Per mom has only pumped x 1 since yesterday.  LC reviewed importance of supply and demand , and gradually stretching the baby's stomach so she gets hungry , increase weight ,  Increase energy level, and decrease bilirubin so lights can be D/C. LC recommended instead of finger feeding to change  to SNS at the  breast to give mom stimulation to get the milk coming quicker. Sore nipple and engorgement prevention and tx reviewed.  LC referred to the Baby and me booklet pages 24 -25. LC stressed the importance of breast stimulation consistently to establish and protect milk supply.  Mother informed of post-discharge support and given phone number to the lactation department, including services for phone call assistance; out-patient appointments; and breastfeeding support group. List of other breastfeeding resources in the community given in the handout. Encouraged mother to call for problems or concerns related to breastfeeding.Mother informed of post-discharge support and given phone number to the lactation department, including services for phone call assistance; out-patient appointments; and breastfeeding support group. List of other breastfeeding resources in the community given in the handout. Encouraged mother to call for problems or concerns related to breastfeeding.  Mom aware to call for next feeding so LC can show her how to use a SNS .  LC Plan to feed with SNS on the 1st breast 20 mins , ( 30 ml of supplement ) , post pump both breast 10 -120 mins ,next feeding switch breast - latch with SNS feed 20  mins , post pump . Mom and dad receptive to [facility] Plan and recommendation.    Maternal Data    Feeding Feeding Type:  (baby just fed fonula via finger feeding )  LATCH Score/Interventions                Intervention(s): Breastfeeding basics reviewed (see LC note )     Lactation Tools Discussed/Used Tools: Pump Breast pump type: Double-Electric Breast Pump (per mom has only pumped x 1 since yesterday ) Pump Review: Milk Storage Initiated by:: MAI -  Date initiated:: 09/02/15   Consult Status Consult Status: Follow-up Date: 09/02/15 Follow-up type: In-patient    Kathrin Greathouseorio, [name] 09/02/2015, 9:45 AM

## 2015-09-18 ENCOUNTER — Inpatient Hospital Stay (HOSPITAL_COMMUNITY)
Admission: AD | Admit: 2015-09-18 | Payer: BLUE CROSS/BLUE SHIELD | Source: Ambulatory Visit | Admitting: Obstetrics & Gynecology

## 2015-11-25 IMAGING — RF DG HYSTEROGRAM
5 series · 5 of 5 positions shown · IV contrast (omnipaque)
Comparison: None.

FLUOROSCOPY TIME:  Fluoroscopy Time:  54 seconds

CLINICAL DATA: Infertility

EXAM:
HYSTEROSALPINGOGRAM
TECHNIQUE: Following cleansing of the cervix and vagina with Betadine solution,
a hysterosalpingogram was performed using a 5-French
hysterosalpingogram catheter and Omnipaque 300 contrast. The patient
tolerated the examination without difficulty.

[Series 1: run · 1 of 1 slices shown (1 of 5)]
[im 1/1]
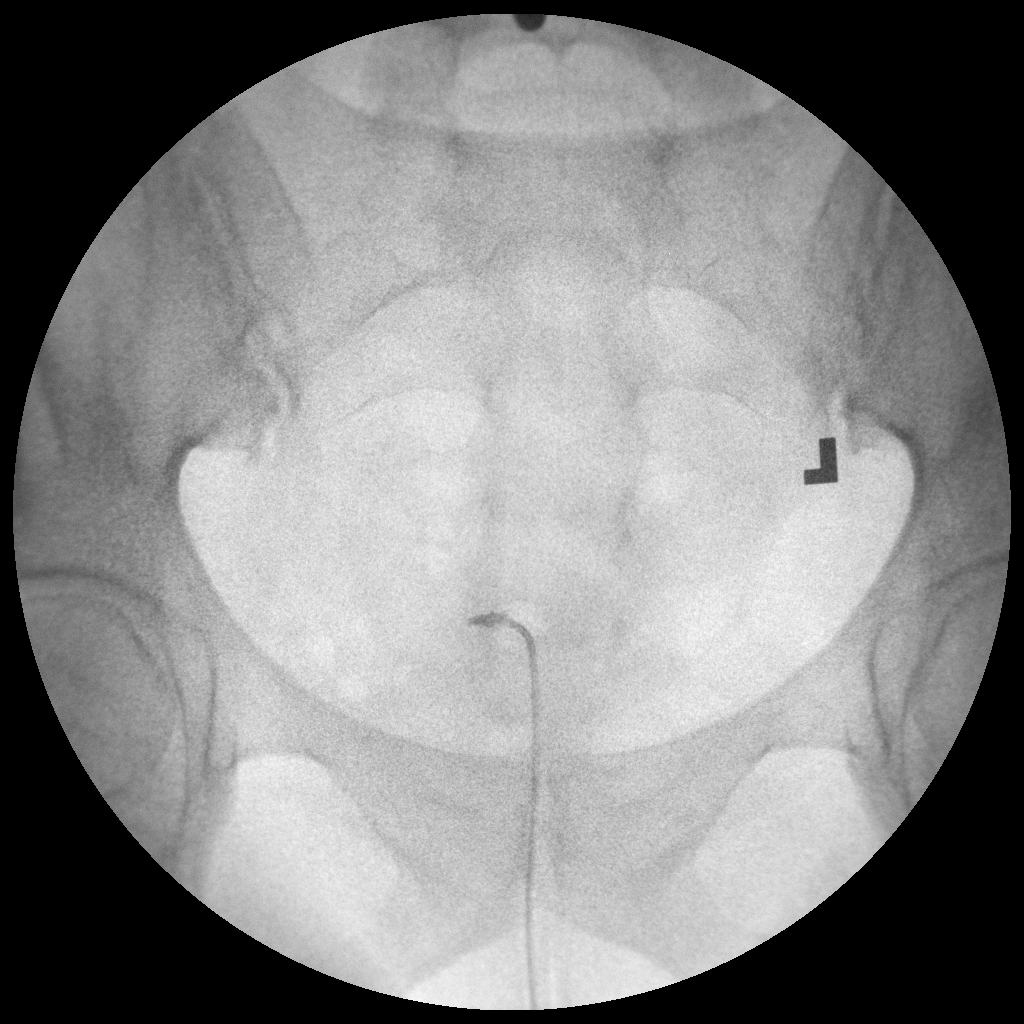

[Series 2: run · 1 of 1 slices shown (2 of 5)]
[im 1/1]
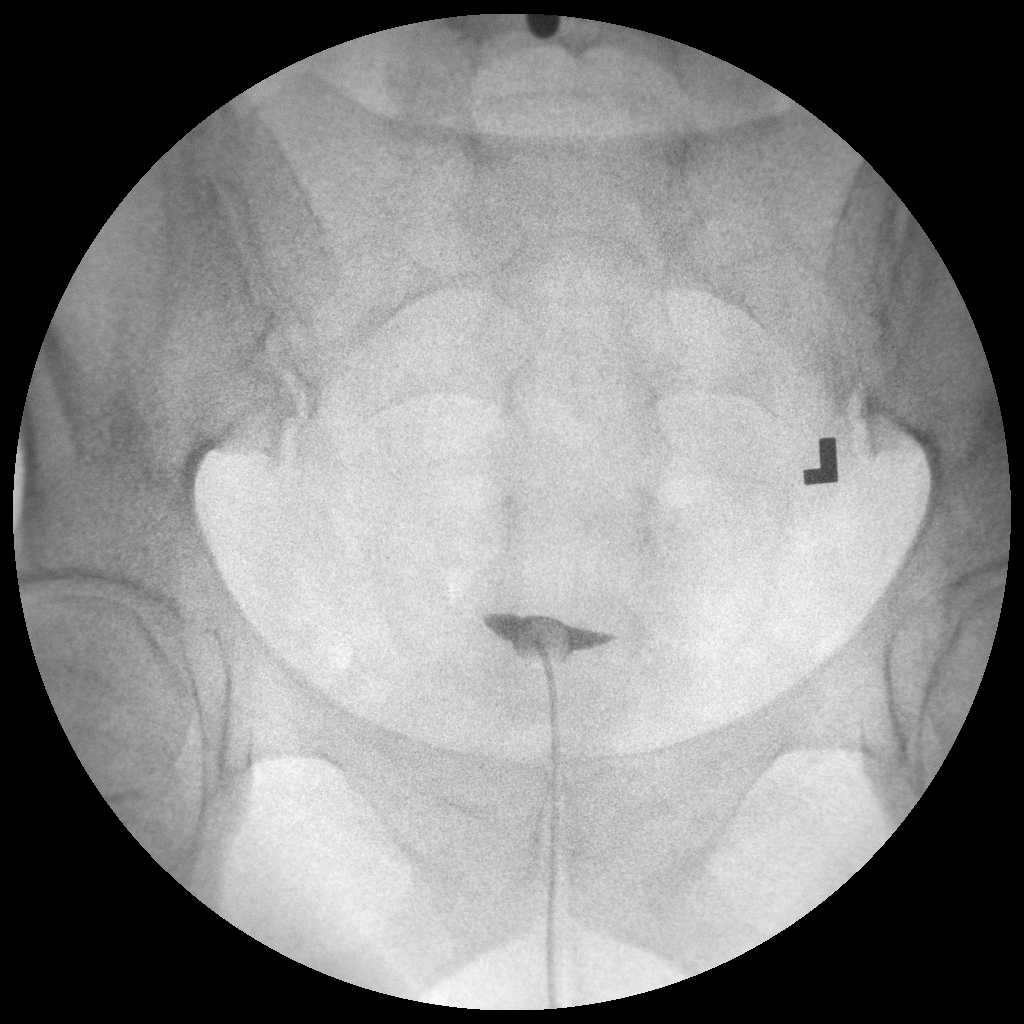

[Series 3: run · 1 of 1 slices shown (3 of 5)]
[im 1/1]
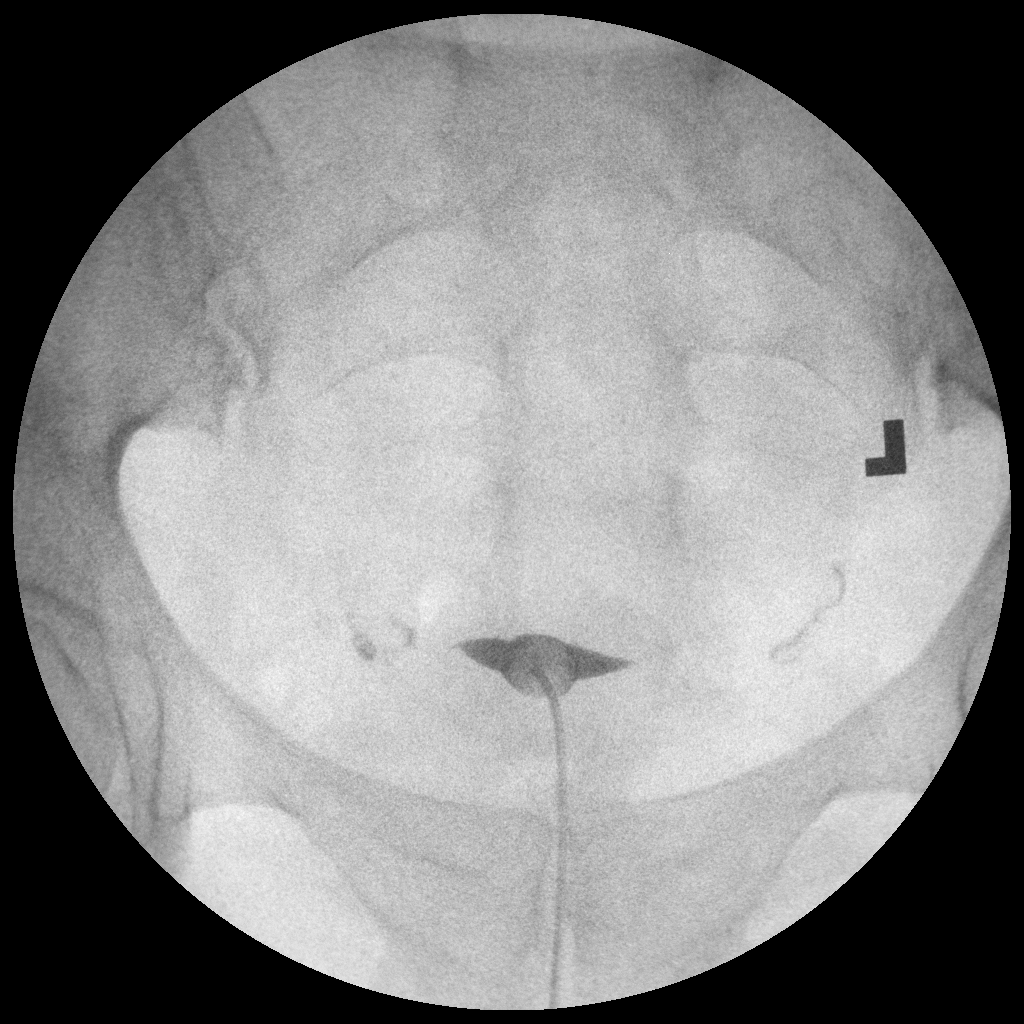

[Series 4: run · 1 of 1 slices shown (4 of 5)]
[im 1/1]
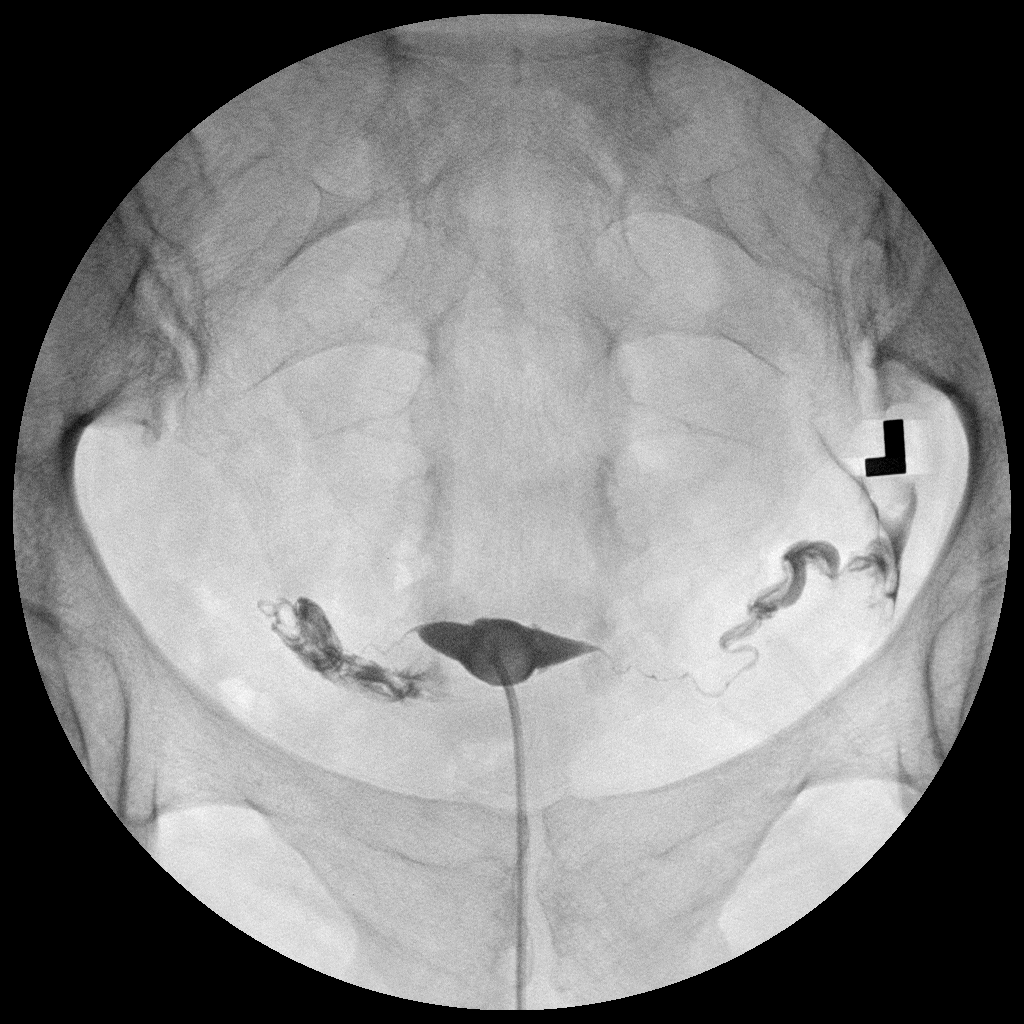

[Series 5: run · 1 of 1 slices shown (5 of 5)]
[im 1/1]
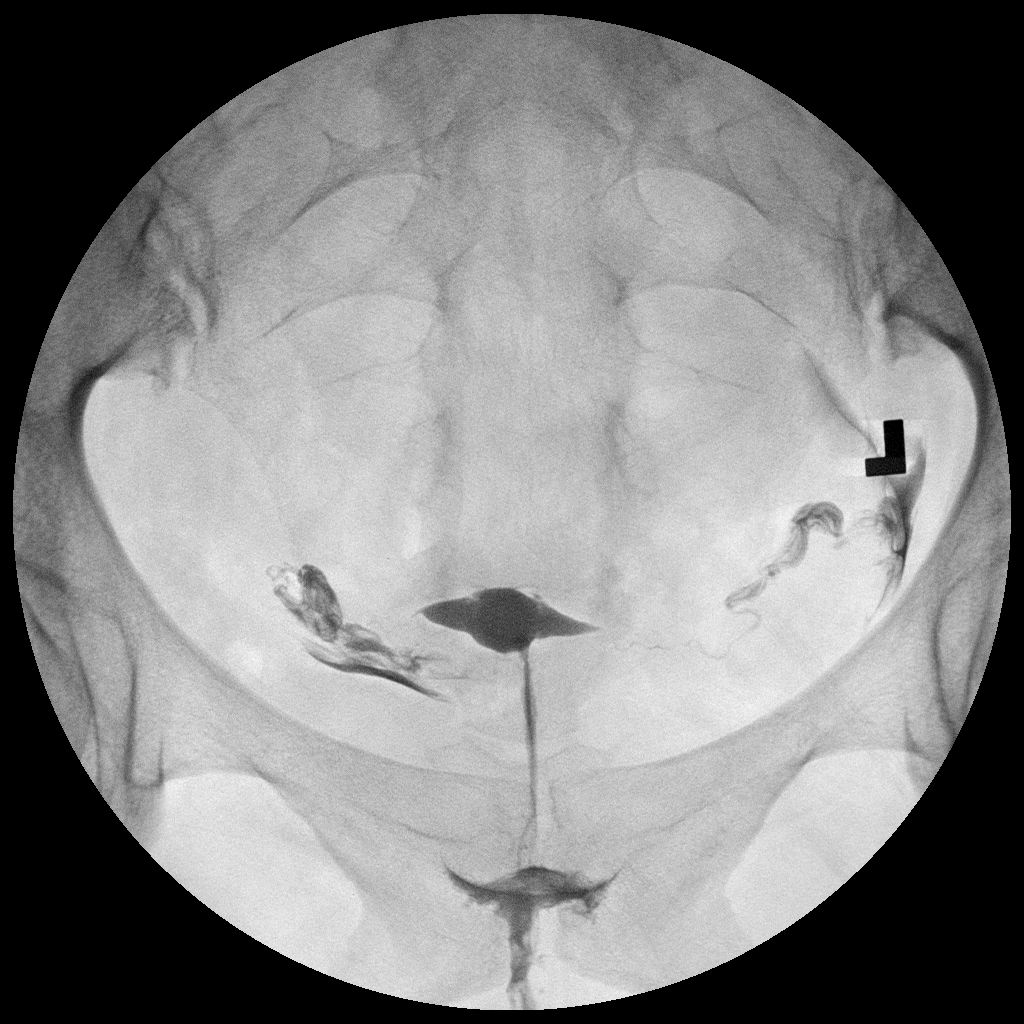

[5 of 5 positions shown; findings below may reference images not displayed]

FINDINGS: The endometrial canal is normal in contour and appearance. The
fallopian tubes are normal in appearance. Free intraperitoneal spill
is identified bilaterally.
IMPRESSION: Normal exam.  Bilateral tubal patency.
